# Patient Record
Sex: Male | Born: 1940 | Race: White | Hispanic: No
Health system: Southern US, Community
[De-identification: ages and names within clinical notes are randomized; demographics above are authoritative.]

---

## 2005-02-18 ENCOUNTER — Ambulatory Visit: Payer: Self-pay | Admitting: Unknown Physician Specialty

## 2005-04-30 ENCOUNTER — Ambulatory Visit: Payer: Self-pay | Admitting: Anesthesiology

## 2005-06-05 ENCOUNTER — Ambulatory Visit: Payer: Self-pay | Admitting: Anesthesiology

## 2005-07-16 ENCOUNTER — Ambulatory Visit: Payer: Self-pay | Admitting: Anesthesiology

## 2005-08-13 ENCOUNTER — Ambulatory Visit: Payer: Self-pay | Admitting: Anesthesiology

## 2005-09-25 ENCOUNTER — Ambulatory Visit: Payer: Self-pay | Admitting: Anesthesiology

## 2005-10-06 ENCOUNTER — Ambulatory Visit: Payer: Self-pay | Admitting: Anesthesiology

## 2005-10-28 ENCOUNTER — Ambulatory Visit: Payer: Self-pay | Admitting: Anesthesiology

## 2005-12-01 ENCOUNTER — Ambulatory Visit: Payer: Self-pay | Admitting: Anesthesiology

## 2006-01-07 ENCOUNTER — Ambulatory Visit: Payer: Self-pay | Admitting: Anesthesiology

## 2006-02-03 ENCOUNTER — Ambulatory Visit: Payer: Self-pay | Admitting: Anesthesiology

## 2006-02-08 ENCOUNTER — Emergency Department: Payer: Self-pay | Admitting: Emergency Medicine

## 2006-02-08 ENCOUNTER — Other Ambulatory Visit: Payer: Self-pay

## 2006-02-23 ENCOUNTER — Ambulatory Visit: Payer: Self-pay | Admitting: Anesthesiology

## 2006-05-06 ENCOUNTER — Ambulatory Visit: Payer: Self-pay | Admitting: Anesthesiology

## 2006-06-03 ENCOUNTER — Ambulatory Visit: Payer: Self-pay | Admitting: Anesthesiology

## 2006-07-13 ENCOUNTER — Ambulatory Visit: Payer: Self-pay | Admitting: Anesthesiology

## 2006-07-20 ENCOUNTER — Ambulatory Visit: Payer: Self-pay | Admitting: Gastroenterology

## 2006-12-21 ENCOUNTER — Ambulatory Visit: Payer: Self-pay | Admitting: Anesthesiology

## 2007-01-21 ENCOUNTER — Ambulatory Visit: Payer: Self-pay | Admitting: Anesthesiology

## 2007-02-22 ENCOUNTER — Ambulatory Visit: Payer: Self-pay | Admitting: Anesthesiology

## 2007-03-16 ENCOUNTER — Ambulatory Visit: Payer: Self-pay | Admitting: Anesthesiology

## 2007-04-21 ENCOUNTER — Ambulatory Visit: Payer: Self-pay | Admitting: Anesthesiology

## 2007-06-16 ENCOUNTER — Ambulatory Visit: Payer: Self-pay | Admitting: Anesthesiology

## 2007-12-15 ENCOUNTER — Ambulatory Visit: Payer: Self-pay | Admitting: Anesthesiology

## 2007-12-28 ENCOUNTER — Ambulatory Visit: Payer: Self-pay | Admitting: Cardiovascular Disease

## 2008-09-06 ENCOUNTER — Ambulatory Visit: Payer: Self-pay | Admitting: Physician Assistant

## 2009-02-16 ENCOUNTER — Ambulatory Visit (HOSPITAL_COMMUNITY): Admission: RE | Admit: 2009-02-16 | Discharge: 2009-02-16 | Payer: Self-pay | Admitting: Neurosurgery

## 2009-04-16 ENCOUNTER — Inpatient Hospital Stay (HOSPITAL_COMMUNITY): Admission: RE | Admit: 2009-04-16 | Discharge: 2009-04-16 | Payer: Self-pay | Admitting: Neurosurgery

## 2009-06-08 ENCOUNTER — Ambulatory Visit: Payer: Self-pay | Admitting: Cardiovascular Disease

## 2009-09-17 ENCOUNTER — Ambulatory Visit: Payer: Self-pay | Admitting: Vascular Surgery

## 2010-07-15 ENCOUNTER — Emergency Department: Payer: Self-pay | Admitting: Emergency Medicine

## 2010-11-26 ENCOUNTER — Inpatient Hospital Stay: Payer: Self-pay | Admitting: Internal Medicine

## 2011-03-04 LAB — COMPREHENSIVE METABOLIC PANEL
Alkaline Phosphatase: 75 U/L (ref 39–117)
BUN: 19 mg/dL (ref 6–23)
Glucose, Bld: 91 mg/dL (ref 70–99)
Potassium: 5.1 mEq/L (ref 3.5–5.1)
Total Protein: 7.1 g/dL (ref 6.0–8.3)

## 2011-03-04 LAB — CBC
HCT: 37.6 % — ABNORMAL LOW (ref 39.0–52.0)
Hemoglobin: 13.1 g/dL (ref 13.0–17.0)
MCHC: 34.8 g/dL (ref 30.0–36.0)
MCV: 98.9 fL (ref 78.0–100.0)
RDW: 14.6 % (ref 11.5–15.5)

## 2011-04-08 NOTE — Op Note (Signed)
NAMESHANT, HENCE NO.:  [id number]   MEDICAL RECORD NO.:  [id number]          PATIENT TYPE:  INP   LOCATION:  3528                         FACILITY:  [facility]   PHYSICIAN:  Cristi Loron, M.D.DATE OF BIRTH:  Sep 21, 1941   DATE OF PROCEDURE:  04/16/2009  DATE OF DISCHARGE:  04/16/2009                               OPERATIVE REPORT   BRIEF HISTORY:  The patient is a 70 year old white male who has had  prior L4-L5 fusion by another surgeon in another town.  The patient did  well after surgery, but has developed symptoms of neurogenic  claudication.  He was worked up with a lumbar myo CT which demonstrated  the patient had spinal stenosis at L2-L3 as well as L3-L4.  I discussed  the various treatment options with the patient and his son including  surgery.  The patient has weighed the risks, benefits and alternatives  of surgery and he decided to proceed with a decompressive laminectomy.   PREOPERATIVE DIAGNOSIS:  Lumbar spinal stenosis, lumbar radiculopathy,  lumbar disk degeneration, and lumbago.   POSTOPERATIVE DIAGNOSIS:  Lumbar spinal stenosis, lumbar radiculopathy,  lumbar disk degeneration, and lumbago.   PROCEDURE:  L2 and L3 laminectomy with bilateral laminotomies to  decompress the bilateral L2, L3 and L4 nerve roots using  microdissection.   SURGEON:  Cristi Loron, MD   ASSISTANT:  Danae Orleans. Venetia Maxon, MD   ANESTHESIA:  General endotracheal.   ESTIMATED BLOOD LOSS:  75 mL.   SPECIMENS:  None.   DRAINS:  None.   COMPLICATIONS:  None.   DESCRIPTION OF PROCEDURE:  The patient was brought to the operating room  by the Anesthesia Team.  General endotracheal anesthesia was induced.  The patient was then turned to the prone position on the Wilson frame.  His lumbosacral region was then shaved with clippers and prepared with  Betadine scrub and Betadine solution.  Sterile drapes were applied.  I  then injected the area to be incised with  Marcaine with epinephrine  solution.  I used a scalpel to make a linear midline incision through  the patient's previous surgical scar.  I used electrocautery performing  bilateral subperiosteal dissection exposing the spinous process lamina  of L2 and L3.  We did encounter some nonabsorbable sutures from his  previous operation which we removed.  We then obtained intraoperative  radiograph to confirm our location and we inserted the cerebellar  retractors for exposure.   We began the decompression by using Leksell rongeur to remove the  spinous process of L2 and L3.  We then used high-speed drill to perform  bilateral L2 and L3 laminotomies.  We encountered quite a bit of  epidural fibrosis/scar tissue at L3-L4 from his prior operation.  We  then used Kerrison punch to complete the L2 and L3 laminectomy.  We  removed the ligamentum flavum at L1-L2, L2-L3 and the epidural scar  tissue at L3-L4.   We then brought the operative microscope into the field and under  intense magnification illumination we completed the  microdissection/decompression.  We used microsection to  free up the  nerve roots from the epidural tissue as well as the epidural scar tissue  from prior operation.  We performed foraminotomies about the bilateral  L2, L3, and L4 nerve roots completing the decompression.  We inspected  the intervertebral disk at L1-L2, L2-L3 and L3-L4 and did not notice  that the disk was bulging somewhat but there was no significant disk  herniations.  At this point, we had a good decompression.  We obtained  hemostasis with bipolar electrocautery.  We irrigated the wound out with  bacitracin solution.  We then removed the retractors and reapproximated  the patient's thoracolumbar fascia with interrupted #1 Vicryl suture,  subcutaneous tissue with interrupted 2-0 Vicryl suture and the skin with  Steri-Strips and Benzoin.  The wound was then coated with bacitracin  ointment.  Sterile dressing  was applied.  The drapes were removed and  the patient was subsequently returned to supine position.  He was  extubated by Anesthesia Team and transported to post anesthesia care  unit in stable condition.  All sponge, instrument and needle counts were  correct at the end of this case.      Cristi Loron, M.D.  Electronically Signed     JDJ/MEDQ  D:  04/16/2009  T:  04/17/2009  Job:  [id number]

## 2011-04-11 NOTE — Discharge Summary (Signed)
[name], Peter Pham NO.:  [id number]   MEDICAL RECORD NO.:  [id number]          PATIENT TYPE:  INP   LOCATION:  3528                         FACILITY:  [facility]   PHYSICIAN:  Cristi Loron, M.D.DATE OF BIRTH:  Dec 17, 1940   DATE OF ADMISSION:  04/16/2009  DATE OF DISCHARGE:  04/16/2009                               DISCHARGE SUMMARY   BRIEF HISTORY:  The patient is a 70 year old white male who has had  prior L4-5 fusion by another surgeon in another town.  The patient did  well after surgery but has developed symptoms of neurogenic  claudication.  The patient was worked up with a lumbar myelo CT which  demonstrated the patient has spinal stenosis at L2-3 and L3-4.  I  discussed the various treatment options with the patient and his son  including surgery.  The patient has weighed the risks, benefits and  alternatives of surgery and decided to proceed with a lumbar  decompressive laminectomy.   For further details of this admission, please refer to typed history and  physical.   HOSPITAL COURSE:  On day of admission I performed a L2-3 laminectomy.  The surgery went well (for full details of this operation please refer  to typed operative note).   POSTOPERATIVE COURSE:  The patient's postoperative course was  unremarkable and he was discharged to home on the day of surgery, i.e.  Apr 16, 2009.   DISCHARGE INSTRUCTIONS:  The patient was given written discharge  instructions.  He is to follow up with me in 4 weeks.   FINAL DIAGNOSES:  L2-3 and L3-4 spinal stenosis, lumbar radiculopathy,  lumbar disk degeneration, lumbago.   PROCEDURE PERFORMED:  1. L2 and L3 laminectomy with bilateral laminotomies.  2. Decompression of bilateral L2, L3 and L4 nerve roots using      microdissection.      Cristi Loron, M.D.  Electronically Signed     Cristi Loron, M.D.  Electronically Signed    JDJ/MEDQ  D:  06/14/2009  T:  06/15/2009  Job:  [id number]

## 2012-08-18 ENCOUNTER — Emergency Department: Payer: Self-pay | Admitting: Emergency Medicine

## 2012-08-18 LAB — COMPREHENSIVE METABOLIC PANEL
BUN: 21 mg/dL — ABNORMAL HIGH (ref 7–18)
Bilirubin,Total: 0.3 mg/dL (ref 0.2–1.0)
Creatinine: 0.99 mg/dL (ref 0.60–1.30)
EGFR (African American): >60
Glucose: 108 mg/dL — ABNORMAL HIGH (ref 65–99)
Potassium: 4.6 mmol/L (ref 3.5–5.1)
SGOT(AST): 19 U/L (ref 15–37)
Sodium: 139 mmol/L (ref 136–145)
Total Protein: 7.6 g/dL (ref 6.4–8.2)

## 2012-08-18 LAB — URINALYSIS, COMPLETE
Bilirubin,UR: NEGATIVE
Glucose,UR: NEGATIVE mg/dL (ref 0–75)
Ketone: NEGATIVE
Leukocyte Esterase: NEGATIVE
Protein: NEGATIVE
RBC,UR: <1 /HPF (ref 0–5)
Specific Gravity: 1.005 (ref 1.003–1.030)
Squamous Epithelial: <1

## 2012-08-18 LAB — CBC
HCT: 38 % — ABNORMAL LOW (ref 40.0–52.0)
HGB: 12.9 g/dL — ABNORMAL LOW (ref 13.0–18.0)
MCHC: 34 g/dL (ref 32.0–36.0)
MCV: 99 fL (ref 80–100)
RDW: 14.6 % — ABNORMAL HIGH (ref 11.5–14.5)

## 2012-08-18 LAB — PRO B NATRIURETIC PEPTIDE: B-Type Natriuretic Peptide: 51 pg/mL (ref 0–125)

## 2012-08-18 LAB — CK TOTAL AND CKMB (NOT AT ARMC): CK-MB: 1.6 ng/mL (ref 0.5–3.6)

## 2012-09-19 IMAGING — US US RENAL KIDNEY
1 series · 17 of 25 positions shown · non-contrast
Comparison: none

REASON FOR EXAM: ARF
COMMENTS:

[Series 1: us renal kidney · 17 of 30 slices shown]
[im 1/30]
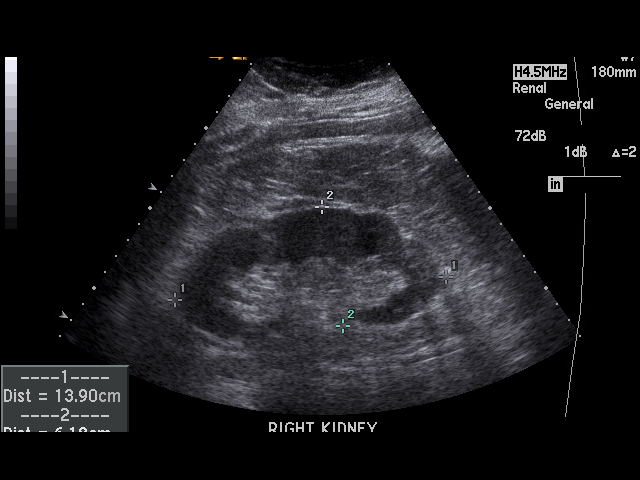
[im 3/30]
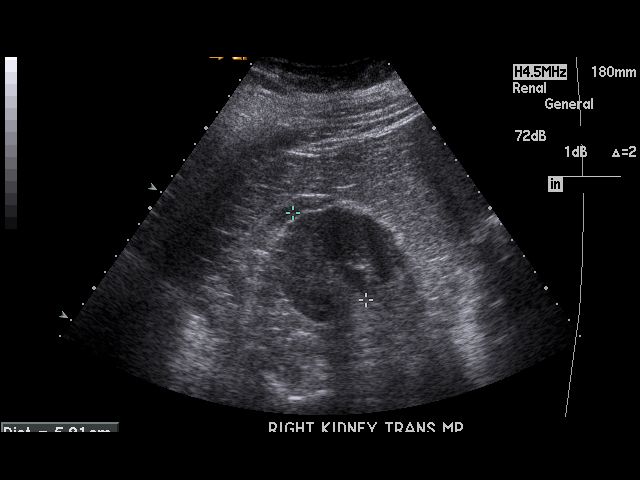
[im 4/30]
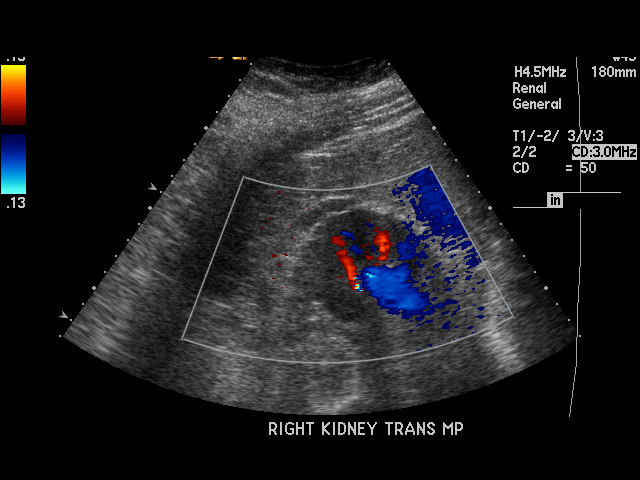
[im 7/30]
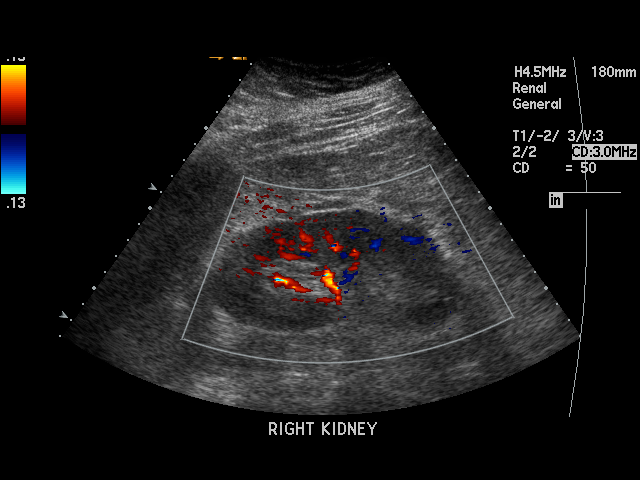
[im 8/30]
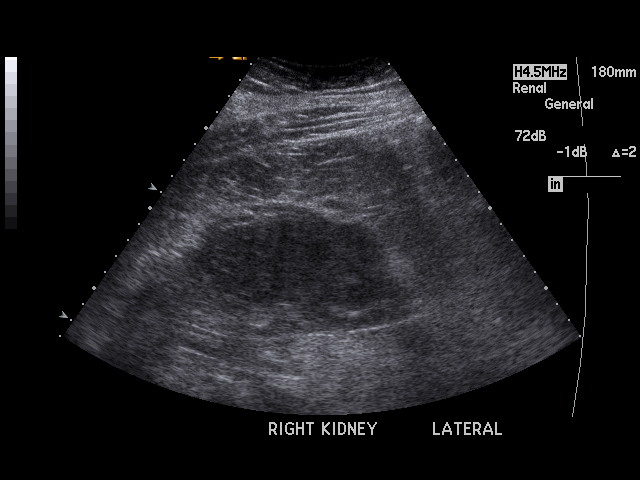
[im 10/30]
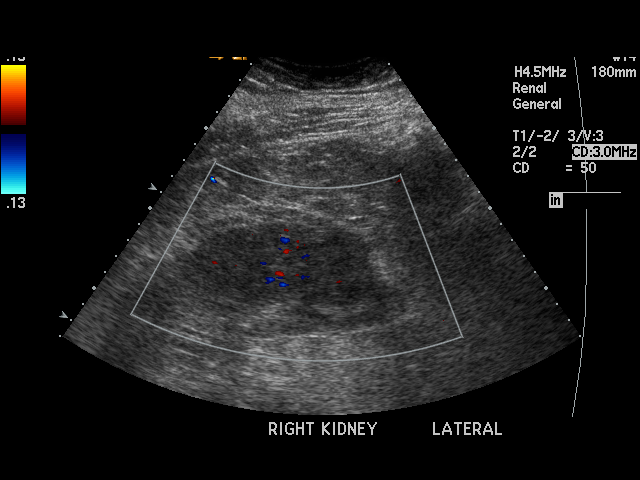
[im 11/30]
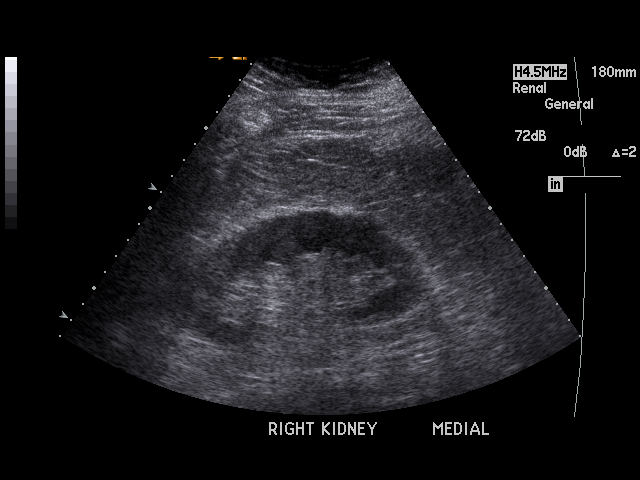
[im 14/30]
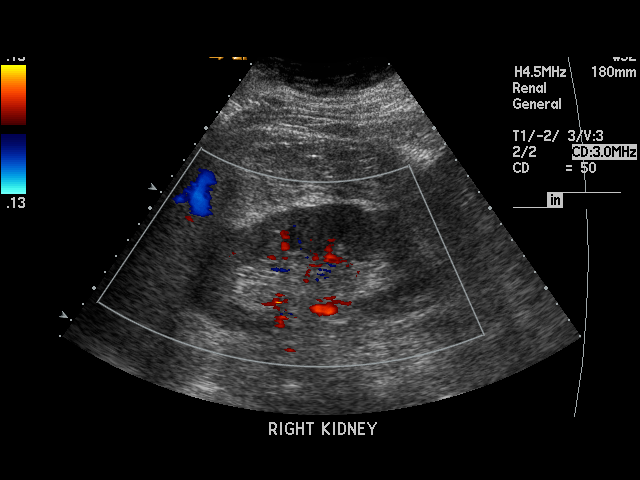
[im 15/30]
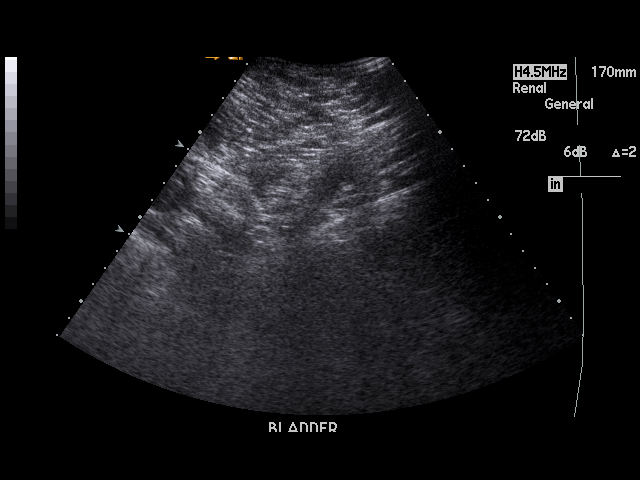
[im 16/30]
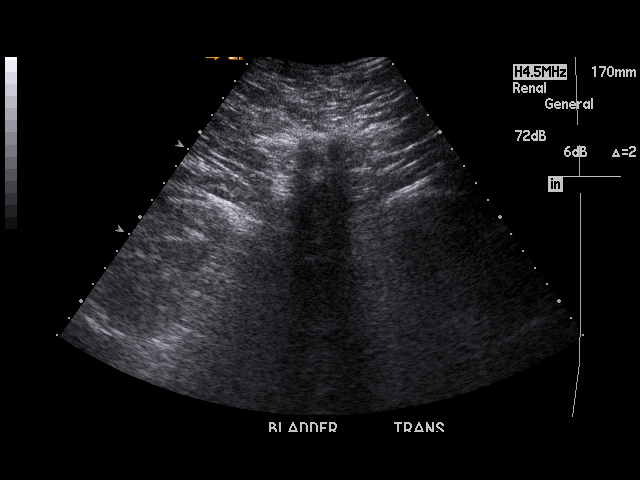
[im 19/30]
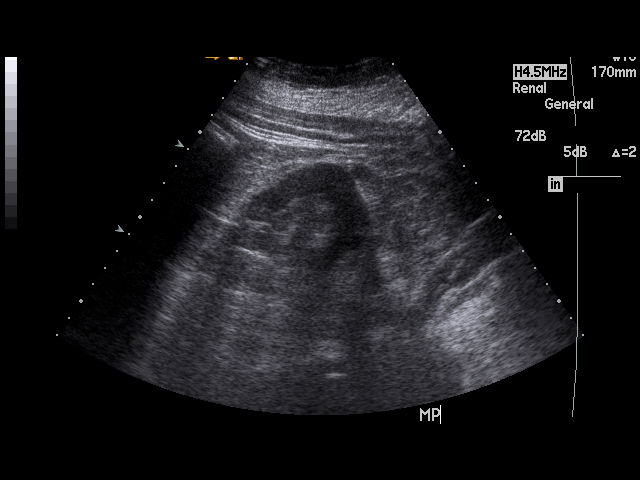
[im 20/30]
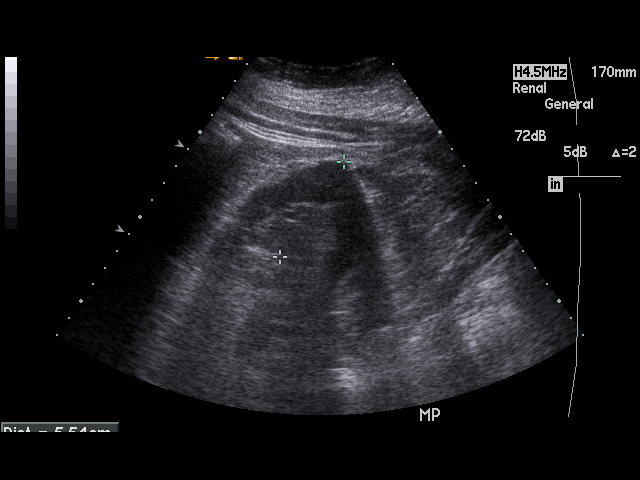
[im 22/30]
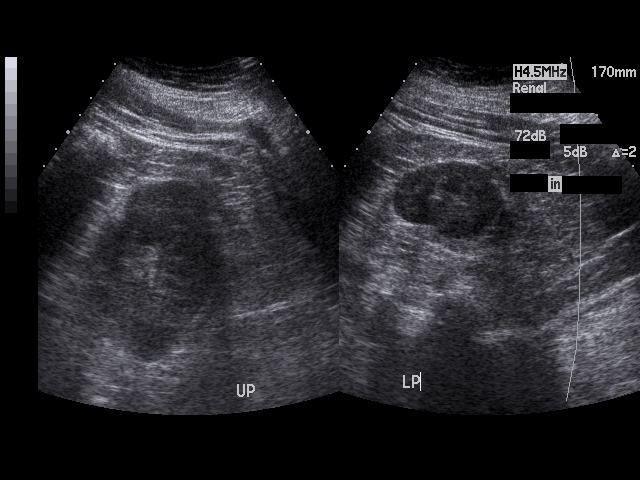
[im 23/30]
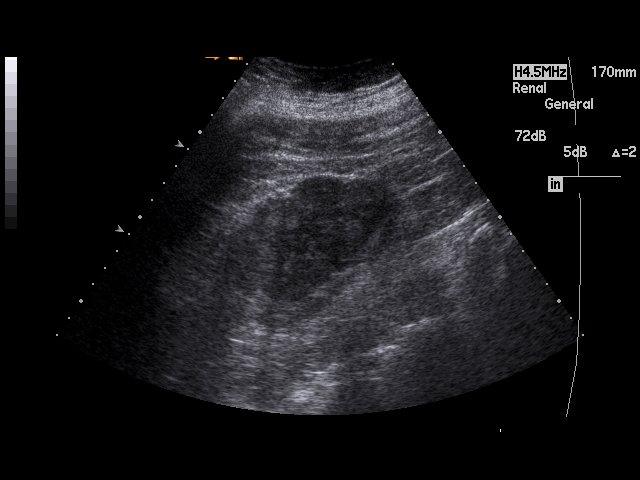
[im 26/30]
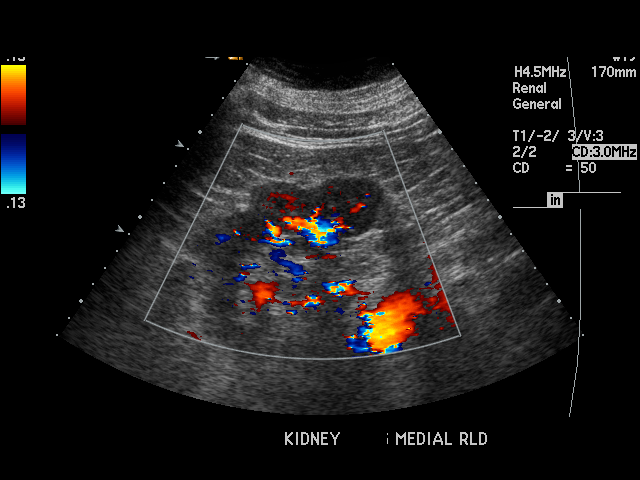
[im 27/30]
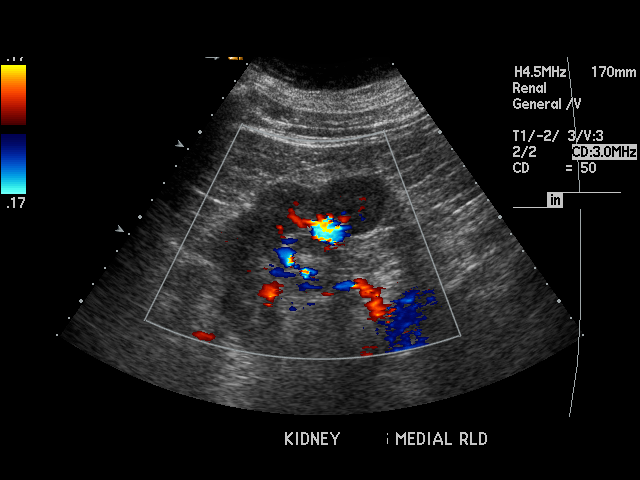
[im 30/30]
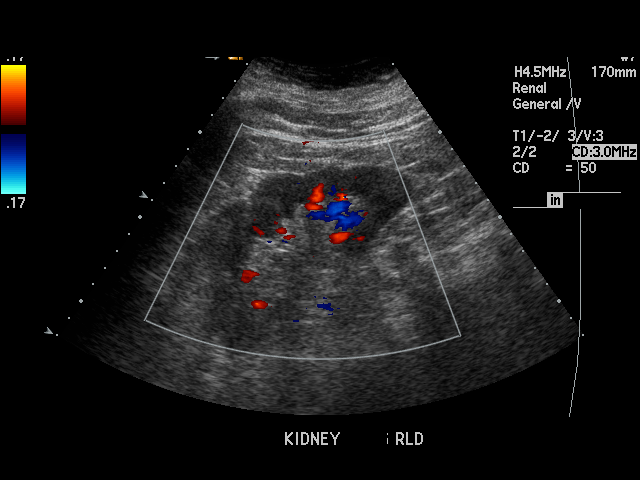

[17 of 25 positions shown; findings below may reference images not displayed]

PROCEDURE:     US  - US KIDNEY  - November 26, 2010  [DATE]

RESULT:     The right kidney measures 13.9 x 6.2 x 5.8 cm. The echotexture
the right kidney remains lower than that of the adjacent liver. The left
kidney measures 11.5 x 6.1 x 5.5 cm. It exhibits a fairly lobulated cortical
contour which likely reflects retained fetal lobations. Neither kidney
exhibits evidence of shadowing stones nor of hydronephrosis. I see no
perinephric fluid collections. The urinary bladder is decompressed due to
recent voiding.
IMPRESSION: I do not see acute abnormality of either kidney.

## 2014-03-27 ENCOUNTER — Inpatient Hospital Stay: Payer: Self-pay

## 2014-03-27 LAB — THEOPHYLLINE LEVEL

## 2014-03-27 LAB — COMPREHENSIVE METABOLIC PANEL
ALK PHOS: 67 U/L
Albumin: 2.9 g/dL — ABNORMAL LOW (ref 3.4–5.0)
Anion Gap: 8 (ref 7–16)
BUN: 33 mg/dL — AB (ref 7–18)
Bilirubin,Total: 0.7 mg/dL (ref 0.2–1.0)
CALCIUM: 8.5 mg/dL (ref 8.5–10.1)
CHLORIDE: 99 mmol/L (ref 98–107)
Co2: 21 mmol/L (ref 21–32)
Creatinine: 1.65 mg/dL — ABNORMAL HIGH (ref 0.60–1.30)
GFR CALC AF AMER: 48 — AB
GFR CALC NON AF AMER: 41 — AB
GLUCOSE: 110 mg/dL — AB (ref 65–99)
OSMOLALITY: 265 (ref 275–301)
Potassium: 4.3 mmol/L (ref 3.5–5.1)
SGOT(AST): 47 U/L — ABNORMAL HIGH (ref 15–37)
SGPT (ALT): 24 U/L (ref 12–78)
SODIUM: 128 mmol/L — AB (ref 136–145)
TOTAL PROTEIN: 7.6 g/dL (ref 6.4–8.2)

## 2014-03-27 LAB — APTT: ACTIVATED PTT: 36.2 s — AB (ref 23.6–35.9)

## 2014-03-27 LAB — CBC
HCT: 32.6 % — ABNORMAL LOW (ref 40.0–52.0)
HGB: 10.9 g/dL — AB (ref 13.0–18.0)
MCH: 33 pg (ref 26.0–34.0)
MCHC: 33.4 g/dL (ref 32.0–36.0)
MCV: 99 fL (ref 80–100)
PLATELETS: 291 10*3/uL (ref 150–440)
RBC: 3.3 10*6/uL — AB (ref 4.40–5.90)
RDW: 14.5 % (ref 11.5–14.5)
WBC: 18.4 10*3/uL — ABNORMAL HIGH (ref 3.8–10.6)

## 2014-03-27 LAB — TROPONIN I

## 2014-03-28 LAB — CBC WITH DIFFERENTIAL/PLATELET
BASOS PCT: 0.1 %
Basophil #: 0 10*3/uL (ref 0.0–0.1)
Eosinophil #: 0 10*3/uL (ref 0.0–0.7)
Eosinophil %: 0 %
HCT: 30 % — ABNORMAL LOW (ref 40.0–52.0)
HGB: 10.3 g/dL — AB (ref 13.0–18.0)
LYMPHS PCT: 6.2 %
Lymphocyte #: 0.5 10*3/uL — ABNORMAL LOW (ref 1.0–3.6)
MCH: 33.3 pg (ref 26.0–34.0)
MCHC: 34.2 g/dL (ref 32.0–36.0)
MCV: 98 fL (ref 80–100)
MONOS PCT: 3.3 %
Monocyte #: 0.3 x10 3/mm (ref 0.2–1.0)
NEUTROS ABS: 7.9 10*3/uL — AB (ref 1.4–6.5)
Neutrophil %: 90.4 %
Platelet: 241 10*3/uL (ref 150–440)
RBC: 3.08 10*6/uL — ABNORMAL LOW (ref 4.40–5.90)
RDW: 14.5 % (ref 11.5–14.5)
WBC: 8.7 10*3/uL (ref 3.8–10.6)

## 2014-03-28 LAB — MAGNESIUM: MAGNESIUM: 2.4 mg/dL

## 2014-03-28 LAB — BASIC METABOLIC PANEL
Anion Gap: 7 (ref 7–16)
BUN: 35 mg/dL — AB (ref 7–18)
CALCIUM: 8.8 mg/dL (ref 8.5–10.1)
CHLORIDE: 101 mmol/L (ref 98–107)
CREATININE: 1.29 mg/dL (ref 0.60–1.30)
Co2: 25 mmol/L (ref 21–32)
EGFR (African American): >60
GFR CALC NON AF AMER: 56 — AB
Glucose: 151 mg/dL — ABNORMAL HIGH (ref 65–99)
OSMOLALITY: 277 (ref 275–301)
POTASSIUM: 4.4 mmol/L (ref 3.5–5.1)
Sodium: 133 mmol/L — ABNORMAL LOW (ref 136–145)

## 2014-03-28 LAB — TSH: THYROID STIMULATING HORM: 0.084 u[IU]/mL — AB

## 2014-03-29 LAB — CBC WITH DIFFERENTIAL/PLATELET
BASOS PCT: 0.2 %
Basophil #: 0 10*3/uL (ref 0.0–0.1)
Eosinophil #: 0 10*3/uL (ref 0.0–0.7)
Eosinophil %: 0 %
HCT: 31.9 % — ABNORMAL LOW (ref 40.0–52.0)
HGB: 10.7 g/dL — ABNORMAL LOW (ref 13.0–18.0)
LYMPHS PCT: 5.6 %
Lymphocyte #: 0.6 10*3/uL — ABNORMAL LOW (ref 1.0–3.6)
MCH: 33.2 pg (ref 26.0–34.0)
MCHC: 33.6 g/dL (ref 32.0–36.0)
MCV: 99 fL (ref 80–100)
MONO ABS: 0.4 x10 3/mm (ref 0.2–1.0)
MONOS PCT: 3.3 %
NEUTROS ABS: 10 10*3/uL — AB (ref 1.4–6.5)
NEUTROS PCT: 90.9 %
PLATELETS: 294 10*3/uL (ref 150–440)
RBC: 3.23 10*6/uL — AB (ref 4.40–5.90)
RDW: 14.5 % (ref 11.5–14.5)
WBC: 11 10*3/uL — AB (ref 3.8–10.6)

## 2014-03-29 LAB — BASIC METABOLIC PANEL
ANION GAP: 6 — AB (ref 7–16)
BUN: 33 mg/dL — ABNORMAL HIGH (ref 7–18)
CALCIUM: 8.9 mg/dL (ref 8.5–10.1)
CO2: 24 mmol/L (ref 21–32)
CREATININE: 1.08 mg/dL (ref 0.60–1.30)
Chloride: 105 mmol/L (ref 98–107)
EGFR (Non-African Amer.): >60
GLUCOSE: 156 mg/dL — AB (ref 65–99)
Osmolality: 281 (ref 275–301)
Potassium: 4.8 mmol/L (ref 3.5–5.1)
Sodium: 135 mmol/L — ABNORMAL LOW (ref 136–145)

## 2014-03-29 LAB — T4, FREE: FREE THYROXINE: 1.35 ng/dL (ref 0.76–1.46)

## 2014-03-30 LAB — TSH: THYROID STIMULATING HORM: 0.083 u[IU]/mL — AB

## 2014-04-01 LAB — BASIC METABOLIC PANEL
Anion Gap: 7 (ref 7–16)
BUN: 26 mg/dL — AB (ref 7–18)
CHLORIDE: 107 mmol/L (ref 98–107)
CREATININE: 1.08 mg/dL (ref 0.60–1.30)
Calcium, Total: 8.7 mg/dL (ref 8.5–10.1)
Co2: 24 mmol/L (ref 21–32)
EGFR (Non-African Amer.): >60
GLUCOSE: 121 mg/dL — AB (ref 65–99)
OSMOLALITY: 282 (ref 275–301)
POTASSIUM: 4.9 mmol/L (ref 3.5–5.1)
Sodium: 138 mmol/L (ref 136–145)

## 2014-04-01 LAB — CBC WITH DIFFERENTIAL/PLATELET
BASOS ABS: 0 10*3/uL (ref 0.0–0.1)
BASOS PCT: 0.1 %
EOS ABS: 0 10*3/uL (ref 0.0–0.7)
Eosinophil %: 0.1 %
HCT: 35.3 % — AB (ref 40.0–52.0)
HGB: 11.4 g/dL — ABNORMAL LOW (ref 13.0–18.0)
LYMPHS PCT: 6.6 %
Lymphocyte #: 0.9 10*3/uL — ABNORMAL LOW (ref 1.0–3.6)
MCH: 32 pg (ref 26.0–34.0)
MCHC: 32.4 g/dL (ref 32.0–36.0)
MCV: 99 fL (ref 80–100)
MONO ABS: 0.3 x10 3/mm (ref 0.2–1.0)
Monocyte %: 2.2 %
NEUTROS ABS: 12.1 10*3/uL — AB (ref 1.4–6.5)
Neutrophil %: 91 %
PLATELETS: 408 10*3/uL (ref 150–440)
RBC: 3.58 10*6/uL — ABNORMAL LOW (ref 4.40–5.90)
RDW: 14.4 % (ref 11.5–14.5)
WBC: 13.3 10*3/uL — AB (ref 3.8–10.6)

## 2014-04-01 LAB — CULTURE, BLOOD (SINGLE)

## 2015-03-17 NOTE — Discharge Summary (Signed)
PATIENT NAME:  Peter Pham, Peter Pham MR#:  [id number] DATE OF BIRTH:  [birth year]  DATE OF ADMISSION:  03/27/2014 DATE OF DISCHARGE:  04/01/2014  PRIMARY CARE PHYSICIAN: Einar CrowMarshall Anderson, M.D.   DISCHARGE DIAGNOSES: 1. Acute-on-chronic respiratory failure.  2. Chronic obstructive pulmonary disease exacerbation.  3. Pneumonia.   DISCHARGE MEDICATIONS: 1. Prednisone 10 mg taper.  2. Levaquin 750 mg daily.  3. Benicar/HCT 12.5/40 mg 1 tablet daily.  4. Plavix 75 mg daily.  5. Aspirin 81 mg daily.  6. Pantoprazole 40 mg daily.   HISTORY OF PRESENT ILLNESS: This is a 74 year old male with history of hypertension, COPD, CHF, who presented with chronic cough and shortness of breath as well as hemoptysis and generalized weakness. The patient was noted to be hypotensive and hypoxic. The patient underwent pulmonary imaging notable for air-space consolidation in the right upper lobe and left lower lobe. CT angiogram showed no evidence of PE. The patient was admitted with acute respiratory failure and bilateral pneumonia.   HOSPITAL COURSE: The patient was initiated on supportive treatment with oxygen and nebulizers. Blood cultures were obtained and these remained negative. He was treated with Levaquin for the pneumonia as well as high-dose steroids. Additionally, he had an elevated creatinine. Diuretics were held. He was given IV fluids. As he had presented with hemoptysis, aspirin was also held. The patient had gradual clinical improvement. Physical therapy was involved. Patient was discharged home once clinically improved.   DISCHARGE INSTRUCTIONS: 1. The patient is to follow up with Dr. Dareen PianoAnderson as scheduled in June.  2. Complete course of antibiotics and steroids.      ____________________________ A. Wendall MolaMelissa Solum, MD ams:lm D: 04/01/2014 11:40:16 ET T: 04/01/2014 22:57:04 ET JOB#: [id number]  cc: A. Wendall MolaMelissa Solum, MD, <Dictator> Marya AmslerMarshall W. Dareen PianoAnderson, MD Caleen JobsA. MELISSA SOLUM MD ELECTRONICALLY SIGNED  04/09/2014 11:49

## 2015-03-17 NOTE — H&P (Signed)
PATIENT NAME:  Peter Pham, CLAXTON MR#:  [id number] DATE OF BIRTH:  May 10, 1941  DATE OF ADMISSION:  03/27/2014  PRIMARY CARE PHYSICIAN: Dr. Einar Crow.  REFERRING PHYSICIAN: Dr. Scotty Court.  CHIEF COMPLAINT: Chronic cough worsening for the past 2 days.   HISTORY OF PRESENT ILLNESS: A 74 year old Caucasian male with a history of hypertension, COPD, CHF, who presented to the ED with chronic cough worsening for the past 2 days with fever or chills. The patient also has hemoptysis and weakness. The patient's blood pressure was low at 80s in ED. A CAT scan of chest showed pneumonia with lung nodules. The patient was treated with Levaquin. The patient denies any chest pain, palpitation, orthopnea, nocturnal dyspnea. No leg edema. The patient has a long history of COPD but not on home oxygen.   PAST MEDICAL HISTORY:  1. Hypertension.  2. OSA on BiPAP at night.  3. History of DVT.  4. Heart failure. 5.  CHF.  6. History of COPD. 7. Chronic back pain. 8. The patient has a history of respiratory failure status post trachelectomy, healed.   SOCIAL HISTORY: Smokes 1 pack a day. Denies any alcohol drinking or illicit drugs.   SURGICAL HISTORY: Tracheostomy, back surgery, colon surgery.  FAMILY HISTORY: Heart disease.   ALLERGIES: None.   HOME MEDICATIONS: Plavix 75 mg p.o. daily, Benicar hydrochlorothiazide 12.5 mg/40 mg p.o. daily, aspirin 81 mg p.o. daily, Percocet 325/10 mg p.o. tablet every 6 hours p.r.n.   REVIEW OF SYSTEMS:  CONSTITUTIONAL: The patient denies any fever, chills, headache, dizziness, and weakness.  EYES: No double vision or blurry vision.  ENT: No postnasal drip, slurred speech, or dysphagia.  CARDIOVASCULAR: No chest pain, palpitation, orthopnea, or nocturnal dyspnea. No leg edema.  PULMONARY: Positive for cough, sputum, shortness of breath, and hemoptysis.  GASTROINTESTINAL: No abdominal pain, nausea, vomiting, diarrhea. No melena or bloody stool medicine.   GENITOURINARY: No dysuria, hematuria, or incontinence.  SKIN: No rash or jaundice.  NEUROLOGIC: No syncope, loss of consciousness or seizure.  ENDOCRINOLOGY: No polyuria, polydipsia, heat or cold intolerance.  HEMATOLOGIC: No easy bleeding or bleeding.   PHYSICAL EXAMINATION:  VITAL SIGNS: Temperature 97.7, blood pressure 104/69, pulse 78, oxygen saturation 93% oxygen nasal cannula 2 liters.  GENERAL: The patient is alert, awake, oriented, in no acute distress.  HEENT: Pupils round, equal and reactive to light and accommodation.  NECK: Supple. No JVD or carotid bruits noted. No lymphadenopathy. No thyromegaly. Mucous membranes moist. Oral mucosa clear oropharynx.  CARDIOVASCULAR: S1, S2 regular rate and rhythm. No murmurs, gallops.  PULMONARY: Bilateral air entry. No wheezing or rales, but has crackles. No use of accessory muscle to breathe.  ABDOMEN: Soft, obese. Bowel sounds present. No distention or tenderness. No obvious organomegaly.  EXTREMITIES: No edema, clubbing, or cyanosis. No calf tenderness. Bilateral pedal pulses present.  SKIN: No rash or jaundice.  NEUROLOGY: A and O x 3. No focal deficit. Power 5/5. Sensation intact.   LABORATORY DATA: CT angiogram of chest did not show PE but has a mediastinal and a hilar adenopathy, may be due to reaction, patchy area of ground glass and air space consolidation in the right upper and lower and left lower lobe most indicative of multilobar pneumonia, pulmonary hemorrhage, not excluded.  Pulmonary nodular densities.  WBC 18.4, hemoglobin 10.9, platelets 291,000, glucose 110, BUN 33, creatinine 1.65, sodium 128, potassium 4.3, chloride 99, bicarbonate 21. Troponin less than 0.02.   Chest x-ray: Low lung volume with concurrent pulmonary interstitial prominence.  ABG showed pH of 7.33, pCO2 of 36, pO2 of 60.   IMPRESSIONS:  1. Bilateral pneumonia.  2. Sepsis.  3. Acute respiratory failure.  4. Hypotension.  5. Acute renal failure.  6.  Hyponatremia.  7. Chronic obstructive pulmonary disease.  8. Obstructive sleep apnea.  9. Anemia.  10. A history of congestive heart failure.   PLAN OF TREATMENT:  1. The patient will be admitted to medical floor. We will continue O2 by nasal cannula. Give nebulizer, Levaquin. Follow up with CBC, blood culture.  2. For acute renal failure hold the Benicar. Start on normal saline IV. Follow up BMP and magnesium level.  3. Since the patient has hemoptysis, we will hold aspirin, Plavix. A CAT scan showed pulmonary nodule. May repeat a chest x-ray or CAT scan after treatment of pneumonia.  4. Discussed the patient's condition and plan of treatment with the patient and the patient's son.  CODE STATUS: The patient wants full code.   TIME SPENT: About 60 minutes.    ____________________________ Shaune PollackQing Anissa Abbs, MD qc:lt D: 03/27/2014 20:31:47 ET T: 03/27/2014 22:05:15 ET JOB#: [id number]  cc: Shaune PollackQing Lawerence Dery, MD, <Dictator> Shaune PollackQING Mina Babula MD ELECTRONICALLY SIGNED 03/29/2014 17:29

## 2016-01-19 IMAGING — CR DG CHEST 1V PORT
1 series · 1 of 1 positions shown · non-contrast
Comparison: DG CHEST 2V dated 07/15/2010

CLINICAL DATA: Shortness of breath

EXAM:
PORTABLE CHEST - 1 VIEW

[ap]
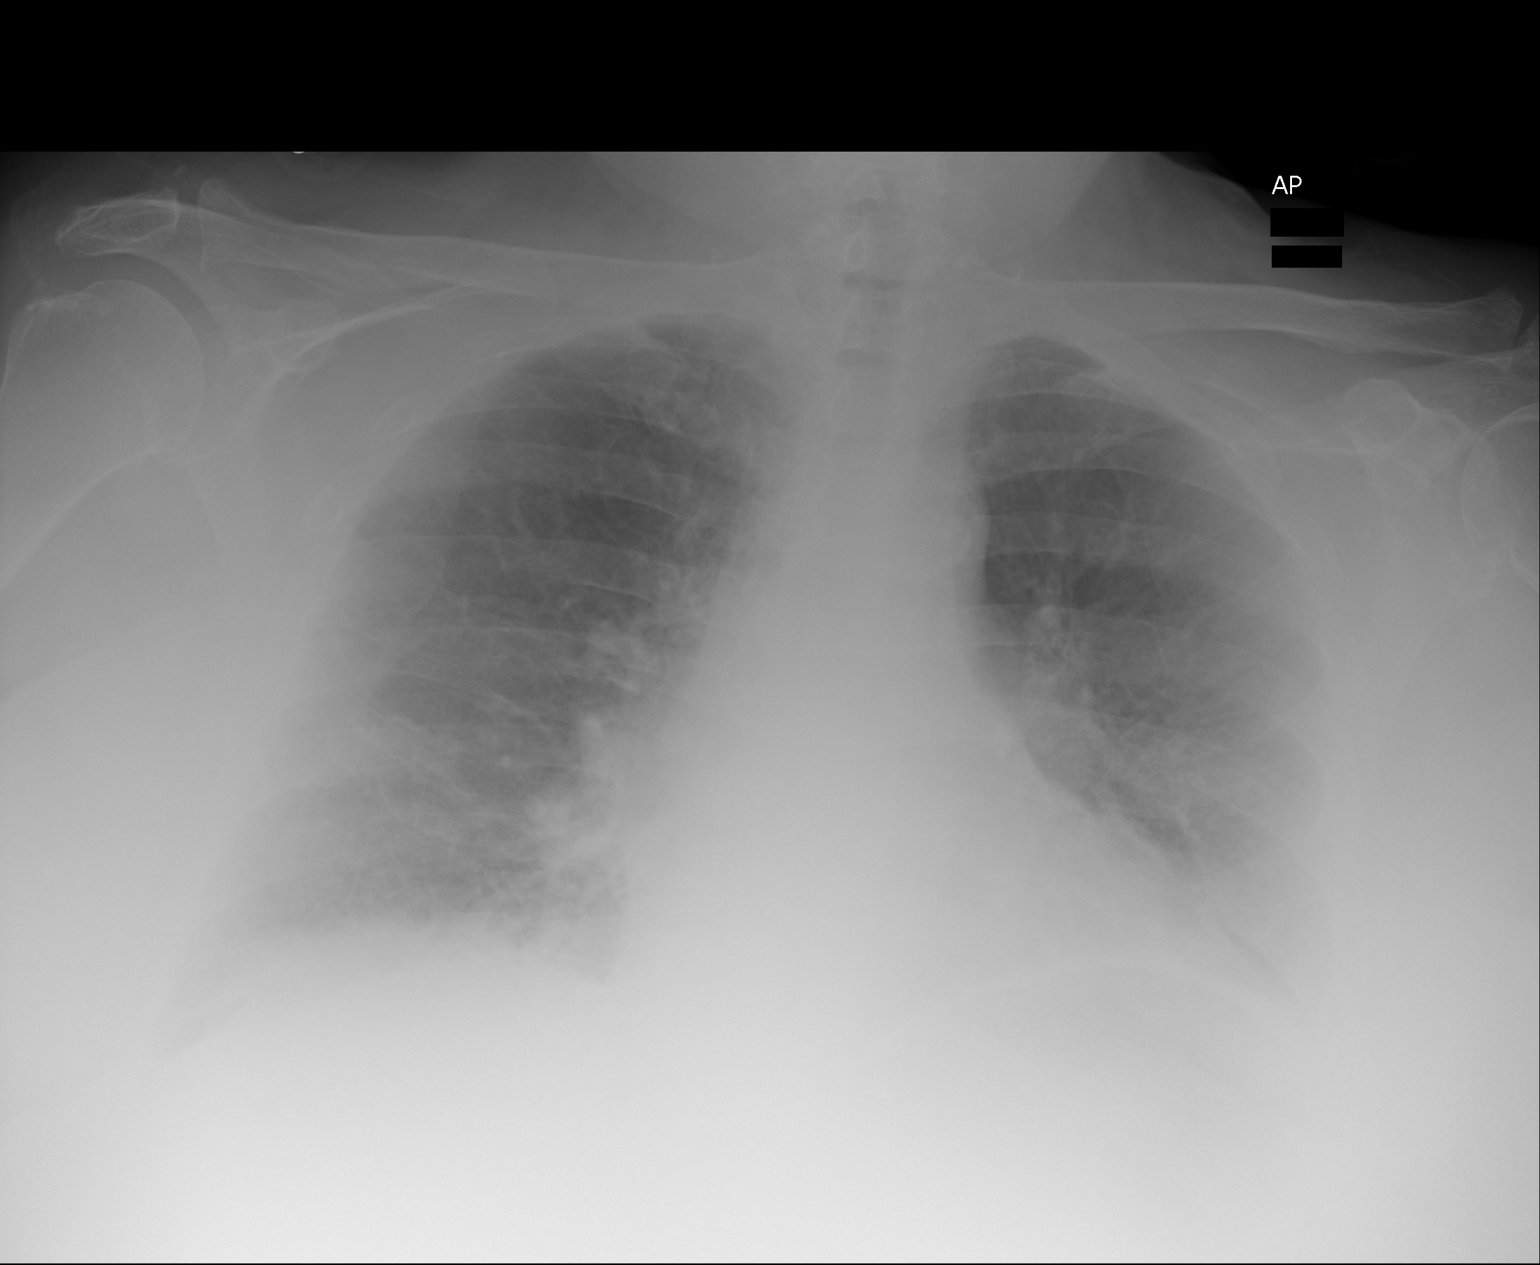

[1 of 1 positions shown; findings below may reference images not displayed]

FINDINGS: Mildly degraded exam due to AP portable technique and patient body
habitus. Midline trachea. Heart size upper normal, accentuated by
low lung volumes. No definite pleural fluid. No pneumothorax. Low
lung volumes. Basilar predominant interstitial prominence is
similar, given differences in inspiratory effort. Aortic
atherosclerosis.
IMPRESSION: Low lung volumes, with concurrent pulmonary interstitial prominence.
Favored to related to chronic bronchitis/ smoking versus early
interstitial lung disease. Mild pulmonary venous congestion could
look similar. No overt congestive failure. PA and lateral
radiographs may be informative.

Aortic atherosclerosis.

## 2019-08-25 DEATH — deceased
